# Patient Record
Sex: Male | Born: 1945 | Race: White | Hispanic: No | Marital: Single | Smoking: Former smoker
Health system: Southern US, Community
[De-identification: ages and names within clinical notes are randomized; demographics above are authoritative.]

## PROBLEM LIST (undated history)

## (undated) DIAGNOSIS — I10 Essential (primary) hypertension: Secondary | ICD-10-CM

## (undated) DIAGNOSIS — E119 Type 2 diabetes mellitus without complications: Secondary | ICD-10-CM

---

## 2017-11-02 ENCOUNTER — Emergency Department: Payer: Non-veteran care

## 2017-11-02 ENCOUNTER — Observation Stay
Admission: EM | Admit: 2017-11-02 | Discharge: 2017-11-03 | Discharge status: Against Medical Advice | Payer: Non-veteran care | Attending: Internal Medicine | Admitting: Internal Medicine

## 2017-11-02 DIAGNOSIS — Z87891 Personal history of nicotine dependence: Secondary | ICD-10-CM | POA: Insufficient documentation

## 2017-11-02 DIAGNOSIS — E860 Dehydration: Secondary | ICD-10-CM | POA: Insufficient documentation

## 2017-11-02 DIAGNOSIS — D649 Anemia, unspecified: Secondary | ICD-10-CM | POA: Insufficient documentation

## 2017-11-02 DIAGNOSIS — Z79899 Other long term (current) drug therapy: Secondary | ICD-10-CM | POA: Insufficient documentation

## 2017-11-02 DIAGNOSIS — E871 Hypo-osmolality and hyponatremia: Secondary | ICD-10-CM | POA: Insufficient documentation

## 2017-11-02 DIAGNOSIS — N179 Acute kidney failure, unspecified: Principal | ICD-10-CM | POA: Insufficient documentation

## 2017-11-02 DIAGNOSIS — E876 Hypokalemia: Secondary | ICD-10-CM | POA: Insufficient documentation

## 2017-11-02 DIAGNOSIS — E8809 Other disorders of plasma-protein metabolism, not elsewhere classified: Secondary | ICD-10-CM | POA: Insufficient documentation

## 2017-11-02 DIAGNOSIS — E878 Other disorders of electrolyte and fluid balance, not elsewhere classified: Secondary | ICD-10-CM | POA: Insufficient documentation

## 2017-11-02 DIAGNOSIS — N281 Cyst of kidney, acquired: Secondary | ICD-10-CM | POA: Insufficient documentation

## 2017-11-02 DIAGNOSIS — Z794 Long term (current) use of insulin: Secondary | ICD-10-CM | POA: Insufficient documentation

## 2017-11-02 DIAGNOSIS — E1165 Type 2 diabetes mellitus with hyperglycemia: Secondary | ICD-10-CM | POA: Insufficient documentation

## 2017-11-02 DIAGNOSIS — E778 Other disorders of glycoprotein metabolism: Secondary | ICD-10-CM | POA: Insufficient documentation

## 2017-11-02 DIAGNOSIS — I700 Atherosclerosis of aorta: Secondary | ICD-10-CM | POA: Insufficient documentation

## 2017-11-02 DIAGNOSIS — I959 Hypotension, unspecified: Secondary | ICD-10-CM | POA: Diagnosis present

## 2017-11-02 DIAGNOSIS — I10 Essential (primary) hypertension: Secondary | ICD-10-CM | POA: Insufficient documentation

## 2017-11-02 DIAGNOSIS — R740 Nonspecific elevation of levels of transaminase and lactic acid dehydrogenase [LDH]: Secondary | ICD-10-CM | POA: Insufficient documentation

## 2017-11-02 DIAGNOSIS — I9589 Other hypotension: Secondary | ICD-10-CM

## 2017-11-02 HISTORY — DX: Essential (primary) hypertension: I10

## 2017-11-02 HISTORY — DX: Type 2 diabetes mellitus without complications: E11.9

## 2017-11-02 LAB — URINALYSIS, COMPLETE (UACMP) WITH MICROSCOPIC
BILIRUBIN URINE: NEGATIVE
Bacteria, UA: NONE SEEN
Glucose, UA: >=500 mg/dL — AB
Hgb urine dipstick: NEGATIVE
KETONES UR: NEGATIVE mg/dL
Leukocytes, UA: NEGATIVE
Nitrite: NEGATIVE
Protein, ur: 100 mg/dL — AB
SPECIFIC GRAVITY, URINE: 1.018 (ref 1.005–1.030)
pH: 5 (ref 5.0–8.0)

## 2017-11-02 LAB — CBC WITH DIFFERENTIAL/PLATELET
BASOS ABS: 0 10*3/uL (ref 0–0.1)
BASOS PCT: 0 %
Eosinophils Absolute: 0.4 10*3/uL (ref 0–0.7)
Eosinophils Relative: 4 %
HEMATOCRIT: 33.1 % — AB (ref 40.0–52.0)
HEMOGLOBIN: 12.1 g/dL — AB (ref 13.0–18.0)
LYMPHS PCT: 9 %
Lymphs Abs: 0.8 10*3/uL — ABNORMAL LOW (ref 1.0–3.6)
MCH: 31.1 pg (ref 26.0–34.0)
MCHC: 36.7 g/dL — ABNORMAL HIGH (ref 32.0–36.0)
MCV: 84.8 fL (ref 80.0–100.0)
Monocytes Absolute: 0.7 10*3/uL (ref 0.2–1.0)
Monocytes Relative: 8 %
NEUTROS ABS: 7.5 10*3/uL — AB (ref 1.4–6.5)
NEUTROS PCT: 79 %
Platelets: 241 10*3/uL (ref 150–440)
RBC: 3.9 MIL/uL — AB (ref 4.40–5.90)
RDW: 13.6 % (ref 11.5–14.5)
WBC: 9.5 10*3/uL (ref 3.8–10.6)

## 2017-11-02 LAB — COMPREHENSIVE METABOLIC PANEL
ALBUMIN: 3.3 g/dL — AB (ref 3.5–5.0)
ALK PHOS: 73 U/L (ref 38–126)
ALT: 19 U/L (ref 0–44)
AST: 48 U/L — ABNORMAL HIGH (ref 15–41)
Anion gap: 11 (ref 5–15)
BILIRUBIN TOTAL: 0.9 mg/dL (ref 0.3–1.2)
BUN: 41 mg/dL — AB (ref 8–23)
CO2: 29 mmol/L (ref 22–32)
Calcium: 8.8 mg/dL — ABNORMAL LOW (ref 8.9–10.3)
Chloride: 92 mmol/L — ABNORMAL LOW (ref 98–111)
Creatinine, Ser: 1.95 mg/dL — ABNORMAL HIGH (ref 0.61–1.24)
GFR calc Af Amer: 38 mL/min — ABNORMAL LOW (ref >60)
GFR, EST NON AFRICAN AMERICAN: 33 mL/min — AB (ref >60)
GLUCOSE: 256 mg/dL — AB (ref 70–99)
POTASSIUM: 3.3 mmol/L — AB (ref 3.5–5.1)
Sodium: 132 mmol/L — ABNORMAL LOW (ref 135–145)
TOTAL PROTEIN: 6 g/dL — AB (ref 6.5–8.1)

## 2017-11-02 LAB — TROPONIN I

## 2017-11-02 LAB — GLUCOSE, CAPILLARY: Glucose-Capillary: 260 mg/dL — ABNORMAL HIGH (ref 70–99)

## 2017-11-02 MED ORDER — SODIUM CHLORIDE 0.9 % IV SOLN
Freq: Once | INTRAVENOUS | Status: AC
  Administered 2017-11-02: 20:00:00 via INTRAVENOUS

## 2017-11-02 MED ORDER — SODIUM CHLORIDE 0.9 % IV BOLUS
1000.0000 mL | Freq: Once | INTRAVENOUS | Status: AC
  Administered 2017-11-02: 1000 mL via INTRAVENOUS

## 2017-11-02 NOTE — ED Notes (Signed)
Pt sleeping soundly in darkened exam room; resp even/unlab with no distress noted 

## 2017-11-02 NOTE — ED Notes (Signed)
Per [place], [place] has rejected pt for transfer to [facility].

## 2017-11-02 NOTE — ED Triage Notes (Addendum)
Pt BIB [facility] from home for hypotension. Friend came to visit patient and stated pt did not look well, took pt to a restaurant and there they called EMS. BP initially 52/20, PIV placed by EMS and 800cc NS infused. BP 80/60. Pt endorses progressive dizziness over last few days. Blood glucose 238. Pt endorses constant pain to L neck for 3 weeks as well. Pt denies CP/SOB/N/V/D. Pt takes Lisinopril, states took morning dose and doesn't think took too much.

## 2017-11-02 NOTE — ED Notes (Signed)
Veronese MD to bedside with update 

## 2017-11-02 NOTE — ED Provider Notes (Addendum)
[facility] Emergency Department Provider Note  ____________________________________________  Time seen: Approximately 4:44 PM  I have reviewed the triage vital signs and the nursing notes.   HISTORY  Chief Complaint Hypotension   HPI Angel Ferrell is a 72 y.o. male with a history of diabetes and hypertension who presents to the emergency room for evaluation of hypotension.  Patient reports that over the last 2 to 3 weeks he has been feeling very weak.  Patient reports that 2 weeks ago he had several days of watery diarrhea.  That resolved about 2 weeks ago and since then patient has been feeling very weak and having dizzy spells.  Has had several episodes where he feels like is going to pass out but never fully lost consciousness.  No nausea or vomiting, no fever chills, no chest pain or shortness of breath, no cough, no abdominal pain.  He does not take any blood thinners.  No melena or hematochezia or hematemesis.  No dysuria or hematuria.  Today a friend came to visit him and felt that patient did not look well.  Knowing the patient is a diabetic he drove him to a local restaurant to give him some food but that did not make the patient look any better so he called 911.  When EMS arrived patient's blood pressures systolics in the 50s.  He received IV fluids with improvement of his blood pressure.  Patient takes lisinopril for blood pressure and insulin.  Last took his lisinopril this morning.  Patient reports that he has not had much to eat, he lives alone, all he has had is canned food and water for the last few weeks. No alcohol, drugs.  Patient's only complaint at this time is a sharp pain located in the left side of his neck which has been constant for 3 weeks, not worse with movement, no paresthesias, weakness, or numbness of his extremities. No HA.  PMH DM2 HTN  Allergies Patient has no known allergies.  History reviewed. No pertinent family  history.  Social History Smoking: former Alcohol : former Drugs: no  Review of Systems  Constitutional: Negative for fever. + Generalized weakness and dizziness Eyes: Negative for visual changes. ENT: Negative for sore throat. Neck: + neck pain  Cardiovascular: Negative for chest pain. Respiratory: Negative for shortness of breath. Gastrointestinal: Negative for abdominal pain, vomiting. + diarrhea. Genitourinary: Negative for dysuria. Musculoskeletal: Negative for back pain. Skin: Negative for rash. Neurological: Negative for headaches, weakness or numbness. Psych: No SI or HI  ____________________________________________   PHYSICAL EXAM:  VITAL SIGNS: ED Triage Vitals  Enc Vitals Group     BP 11/02/17 1636 (!) 78/46     Pulse Rate 11/02/17 1636 65     Resp 11/02/17 1636 19     Temp --      Temp src --      SpO2 11/02/17 1636 94 %     Weight 11/02/17 1640 170 lb (77.1 kg)     Height 11/02/17 1640 6' (1.829 m)     Head Circumference --      Peak Flow --      Pain Score 11/02/17 1637 8     Pain Loc --      Pain Edu? --      Excl. in GC? --     Constitutional: Alert and oriented, disheveled.  HEENT:      Head: Normocephalic and atraumatic.         Eyes: Conjunctivae are  normal. Sclera is non-icteric.       Mouth/Throat: Mucous membranes are dry.  Oropharynx is clear      Neck: Supple with no signs of meningismus.  No bruits, full painless range of motion, no masses palpable, pain is not reproducible with movement or palpation of the neck Cardiovascular: Regular rate and rhythm. No murmurs, gallops, or rubs. 2+ symmetrical distal pulses are present in all extremities. No JVD. Respiratory: Normal respiratory effort. Lungs are clear to auscultation bilaterally. No wheezes, crackles, or rhonchi.  Gastrointestinal: Soft, non tender, and non distended with positive bowel sounds. No rebound or guarding. Musculoskeletal: Nontender with normal range of motion in all  extremities. No edema, cyanosis, or erythema of extremities. Neurologic: Normal speech and language. Face is symmetric. Moving all extremities. No gross focal neurologic deficits are appreciated. Skin: Skin is warm, dry and intact. No rash noted. Psychiatric: Mood and affect are normal. Speech and behavior are normal.  ____________________________________________   LABS (all labs ordered are listed, but only abnormal results are displayed)  Labs Reviewed  GLUCOSE, CAPILLARY - Abnormal; Notable for the following components:      Result Value   Glucose-Capillary 260 (*)    All other components within normal limits  CBC WITH DIFFERENTIAL/PLATELET - Abnormal; Notable for the following components:   RBC 3.90 (*)    Hemoglobin 12.1 (*)    HCT 33.1 (*)    MCHC 36.7 (*)    Neutro Abs 7.5 (*)    Lymphs Abs 0.8 (*)    All other components within normal limits  COMPREHENSIVE METABOLIC PANEL - Abnormal; Notable for the following components:   Sodium 132 (*)    Potassium 3.3 (*)    Chloride 92 (*)    Glucose, Bld 256 (*)    BUN 41 (*)    Creatinine, Ser 1.95 (*)    Calcium 8.8 (*)    Total Protein 6.0 (*)    Albumin 3.3 (*)    AST 48 (*)    GFR calc non Af Amer 33 (*)    GFR calc Af Amer 38 (*)    All other components within normal limits  URINALYSIS, COMPLETE (UACMP) WITH MICROSCOPIC - Abnormal; Notable for the following components:   Color, Urine AMBER (*)    APPearance CLOUDY (*)    Glucose, UA >=500 (*)    Protein, ur 100 (*)    All other components within normal limits  TROPONIN I   ____________________________________________  EKG  ED ECG REPORT I, Nita Sickle, the attending physician, personally viewed and interpreted this ECG.  Normal sinus rhythm, rate of 65, normal PR and QRS intervals, prolonged QTC, normal axis, no ST elevations or depressions.  No prior for comparison. ____________________________________________  RADIOLOGY  I have personally reviewed  the images performed during this visit and I agree with the Radiologist's read.   Interpretation by Radiologist:  Dg Chest 2 View  Result Date: 11/02/2017 CLINICAL DATA:  Hypotension. EXAM: CHEST - 2 VIEW COMPARISON:  None. FINDINGS: Normal cardiac and mediastinal contours. Aortic atherosclerosis. No large area pulmonary consolidation. No pleural effusion or pneumothorax. Thoracic spine degenerative changes. IMPRESSION: No acute cardiopulmonary process. Electronically Signed   By: Annia Belt M.D.   On: 11/02/2017 18:02      ____________________________________________   PROCEDURES  Procedure(s) performed: None Procedures Critical Care performed:  None ____________________________________________   INITIAL IMPRESSION / ASSESSMENT AND PLAN / ED COURSE   72 y.o. male with a history of diabetes and hypertension  who presents to the emergency room for evaluation of hypotension.  Patient has had symptoms of lightheadedness and generalized weakness for 2 weeks since having had some diarrhea at home.  The diarrhea has resolved 2 weeks ago.  At this time patient is disheveled, hypotensive,with good mental status and no distress. patient is neurologically intact, looks very dry on exam, blood pressure 78/46 which is identical on both upper extremities. Patient has very strong peripheral pulses palpable on exam x 4 therefore BP was taken manually and agrees with electronic BP cuff read. No palpable pulsatile mass on exam, no tenderness of the abdomen on palpation. EKG with no evidence of ischemia. No tachycardia, no fever. Ddx severe dehydration, anemia. Will give IVF, labs pending.    6:44 PM on 11/02/2017 -----------------------------------------  Labs show acute kidney injury with creatinine of 1.95.  No signs of anemia.  UA negative for UTI.  Chest x-ray with no infiltrate.  Patient's blood pressure has improved after IV fluids.  Will admit for IV hydration.   Clinical Course as of Nov 02 2204  Mon Nov 02, 2017  2206 [facility] and recommended [facility] patient here for hydration, will discuss with Hospitalist    [CV]    Clinical Course User Index [CV] Don Perking [place], MD   _________________________   As part of my medical decision making, I reviewed the following data within the electronic MEDICAL RECORD NUMBER Nursing notes reviewed and incorporated, Labs reviewed , EKG interpreted , Radiograph reviewed , Discussed with admitting physician , Notes from prior ED visits and [place] Controlled Substance Database    Pertinent labs & imaging results that were available during my care of the patient were reviewed by me and considered in my medical decision making (see chart for details).    ____________________________________________   FINAL CLINICAL IMPRESSION(S) / ED DIAGNOSES  Final diagnoses:  AKI (acute kidney injury) (HCC)  Hypotension, unspecified hypotension type      NEW MEDICATIONS STARTED DURING THIS VISIT:  ED Discharge Orders    None       Note:  This document was prepared using Dragon voice recognition software and may include unintentional dictation errors.    Don Perking, [place], MD 11/02/17 1845    Nita Sickle, MD 11/02/17 2206    Nita Sickle, MD 11/02/17 [phone]

## 2017-11-02 NOTE — ED Notes (Signed)
Angel Ferrell [phone] Pts niece would like updates if pt going to [facility]/staying here

## 2017-11-03 ENCOUNTER — Observation Stay
Admit: 2017-11-03 | Discharge: 2017-11-03 | Disposition: A | Discharge status: Home / Self Care | Payer: Non-veteran care | Attending: Internal Medicine | Admitting: Internal Medicine

## 2017-11-03 ENCOUNTER — Encounter: Payer: Self-pay | Admitting: Internal Medicine

## 2017-11-03 ENCOUNTER — Inpatient Hospital Stay: Payer: Non-veteran care

## 2017-11-03 DIAGNOSIS — I959 Hypotension, unspecified: Secondary | ICD-10-CM | POA: Diagnosis present

## 2017-11-03 DIAGNOSIS — I9589 Other hypotension: Secondary | ICD-10-CM

## 2017-11-03 LAB — MAGNESIUM: Magnesium: 1.7 mg/dL (ref 1.7–2.4)

## 2017-11-03 LAB — NA AND K (SODIUM & POTASSIUM), RAND UR
POTASSIUM UR: 24 mmol/L
Sodium, Ur: 12 mmol/L

## 2017-11-03 LAB — PHOSPHORUS: PHOSPHORUS: 1.8 mg/dL — AB (ref 2.5–4.6)

## 2017-11-03 LAB — ECHOCARDIOGRAM COMPLETE
Height: 72 in
Weight: 2720 oz

## 2017-11-03 LAB — PROTEIN, URINE, RANDOM: Total Protein, Urine: 68 mg/dL

## 2017-11-03 LAB — GLUCOSE, CAPILLARY
GLUCOSE-CAPILLARY: 351 mg/dL — AB (ref 70–99)
GLUCOSE-CAPILLARY: 453 mg/dL — AB (ref 70–99)
Glucose-Capillary: 336 mg/dL — ABNORMAL HIGH (ref 70–99)

## 2017-11-03 LAB — TROPONIN I: Troponin I: <0.03 ng/mL (ref <0.03)

## 2017-11-03 LAB — PREALBUMIN: PREALBUMIN: 12.2 mg/dL — AB (ref 18–38)

## 2017-11-03 LAB — CREATININE, URINE, RANDOM: CREATININE, URINE: 287 mg/dL

## 2017-11-03 MED ORDER — ACETAMINOPHEN 325 MG PO TABS: 650.0000 mg | ORAL_TABLET | Freq: Four times a day (QID) | ORAL | Status: DC | PRN

## 2017-11-03 MED ORDER — INSULIN ASPART 100 UNIT/ML ~~LOC~~ SOLN
24.0000 [IU] | Freq: Once | SUBCUTANEOUS | Status: AC
  Administered 2017-11-03: 24 [IU] via SUBCUTANEOUS
  Filled 2017-11-03: qty 1

## 2017-11-03 MED ORDER — INSULIN ASPART 100 UNIT/ML ~~LOC~~ SOLN
0.0000 [IU] | Freq: Three times a day (TID) | SUBCUTANEOUS | Status: DC
  Administered 2017-11-03: 15 [IU] via SUBCUTANEOUS
  Filled 2017-11-03: qty 1

## 2017-11-03 MED ORDER — INSULIN GLARGINE 100 UNIT/ML ~~LOC~~ SOLN
8.0000 [IU] | Freq: Every day | SUBCUTANEOUS | Status: DC
  Administered 2017-11-03: 8 [IU] via SUBCUTANEOUS
  Filled 2017-11-03: qty 0.08

## 2017-11-03 MED ORDER — POTASSIUM CHLORIDE CRYS ER 20 MEQ PO TBCR
40.0000 meq | EXTENDED_RELEASE_TABLET | Freq: Once | ORAL | Status: AC
  Administered 2017-11-03: 40 meq via ORAL
  Filled 2017-11-03: qty 2

## 2017-11-03 MED ORDER — ONDANSETRON HCL 4 MG PO TABS: 4.0000 mg | ORAL_TABLET | Freq: Four times a day (QID) | ORAL | Status: DC | PRN

## 2017-11-03 MED ORDER — SENNOSIDES-DOCUSATE SODIUM 8.6-50 MG PO TABS: 1.0000 | ORAL_TABLET | Freq: Every evening | ORAL | Status: DC | PRN

## 2017-11-03 MED ORDER — GABAPENTIN 300 MG PO CAPS
300.0000 mg | ORAL_CAPSULE | Freq: Three times a day (TID) | ORAL | Status: DC
  Administered 2017-11-03: 300 mg via ORAL
  Filled 2017-11-03 (×3): qty 1

## 2017-11-03 MED ORDER — SODIUM CHLORIDE 0.9% FLUSH
3.0000 mL | Freq: Two times a day (BID) | INTRAVENOUS | Status: DC
  Administered 2017-11-03: 3 mL via INTRAVENOUS

## 2017-11-03 MED ORDER — ONDANSETRON HCL 4 MG/2ML IJ SOLN: 4.0000 mg | Freq: Four times a day (QID) | INTRAMUSCULAR | Status: DC | PRN

## 2017-11-03 MED ORDER — ALUM & MAG HYDROXIDE-SIMETH 200-200-20 MG/5ML PO SUSP: 30.0000 mL | Freq: Four times a day (QID) | ORAL | Status: DC | PRN

## 2017-11-03 MED ORDER — ASPIRIN 81 MG PO CHEW
81.0000 mg | CHEWABLE_TABLET | Freq: Every day | ORAL | Status: DC
  Filled 2017-11-03: qty 1

## 2017-11-03 MED ORDER — SODIUM CHLORIDE 0.9 % IV SOLN
INTRAVENOUS | Status: AC
  Administered 2017-11-03: 05:00:00 via INTRAVENOUS

## 2017-11-03 MED ORDER — MAGNESIUM SULFATE IN D5W 1-5 GM/100ML-% IV SOLN
1.0000 g | Freq: Once | INTRAVENOUS | Status: AC
  Administered 2017-11-03: 1 g via INTRAVENOUS
  Filled 2017-11-03: qty 100

## 2017-11-03 MED ORDER — HEPARIN SODIUM (PORCINE) 5000 UNIT/ML IJ SOLN
5000.0000 [IU] | Freq: Three times a day (TID) | INTRAMUSCULAR | Status: DC
  Administered 2017-11-03: 5000 [IU] via SUBCUTANEOUS
  Filled 2017-11-03: qty 1

## 2017-11-03 MED ORDER — INSULIN ASPART 100 UNIT/ML ~~LOC~~ SOLN: 0.0000 [IU] | Freq: Every day | SUBCUTANEOUS | Status: DC

## 2017-11-03 MED ORDER — ENSURE ENLIVE PO LIQD
237.0000 mL | Freq: Two times a day (BID) | ORAL | Status: DC
  Administered 2017-11-03 (×2): 237 mL via ORAL

## 2017-11-03 MED ORDER — ACETAMINOPHEN 650 MG RE SUPP: 650.0000 mg | Freq: Four times a day (QID) | RECTAL | Status: DC | PRN

## 2017-11-03 MED ORDER — K PHOS MONO-SOD PHOS DI & MONO 155-852-130 MG PO TABS
250.0000 mg | ORAL_TABLET | Freq: Two times a day (BID) | ORAL | Status: DC
  Administered 2017-11-03: 250 mg via ORAL
  Filled 2017-11-03 (×2): qty 1

## 2017-11-03 MED ORDER — INSULIN ASPART 100 UNIT/ML ~~LOC~~ SOLN
5.0000 [IU] | Freq: Three times a day (TID) | SUBCUTANEOUS | Status: DC
  Administered 2017-11-03 (×2): 5 [IU] via SUBCUTANEOUS
  Filled 2017-11-03 (×2): qty 1

## 2017-11-03 MED ORDER — BISACODYL 5 MG PO TBEC
5.0000 mg | DELAYED_RELEASE_TABLET | Freq: Every day | ORAL | Status: DC | PRN
  Filled 2017-11-03: qty 1

## 2017-11-03 NOTE — ED Notes (Signed)
Pt awakens easily with no c/o voiced; pt informed that he will be remaining in the ED until an admission room is available in the hospital; pt voices good understanding

## 2017-11-03 NOTE — Progress Notes (Signed)
*  PRELIMINARY RESULTS* Echocardiogram 2D Echocardiogram has been performed.  Angel Ferrell Angel Ferrell 11/03/2017, 12:47 PM

## 2017-11-03 NOTE — ED Notes (Signed)
Verbal order from Dr. Nemiah Commander for 24 units of novolog for CBG of 453.

## 2017-11-03 NOTE — ED Notes (Signed)
Pt refusing to go upstairs. States he is going to put on his clothes and leave. Pt stating that staff is lying to him. Pt informed that this RN has not lied to him, has been straight forward with pt. Pt asked if he would stay and wait for results of tests, he refuses. States he has to go pay his bills. During pt yelling at this Eula Listen RN brings in pt Ensure. Pt remains refusing to stay. Pt sitting on edge of bed, niece at bedside, states pt is impatient. Paging Dr. Nemiah Commander at this time to update her.

## 2017-11-03 NOTE — ED Notes (Signed)
ECHO being performed at this time ?

## 2017-11-03 NOTE — H&P (Signed)
[facility] [place] [facility]   PATIENT NAME: Angel Ferrell    MR#:  [id number]  DATE OF BIRTH:  01/16/1946  DATE OF ADMISSION:  11/02/2017  PRIMARY CARE PHYSICIAN: [facility]   REQUESTING/REFERRING PHYSICIAN: Nita Sickle, MD  CHIEF COMPLAINT:   Chief Complaint  Patient presents with  . Hypotension    HISTORY OF PRESENT ILLNESS:  Angel Ferrell  is a 72 y.o. caucasian male with a known history of T2IDDM, HTN p/w volume-responsive hypotension. He endorses a  2-3wk Hx of fatigue/malaise/generalized weakness and intermittent lightheadedness/near-syncope. He states he has been "shaky"/tremulous and unsteady on his feet as well. He states the symptoms have been getting progressively worse. He states he is feeling much improved since coming to the ED. He endorses poor PO intake, because he doesn't have enough money, but also because he has poor appetite at times. He states he is taking his home medications, "most of the time", but "forget(s) about twice a week, maybe." He endorses difficulty w/ memory on & off x67mo, but is AAOx3 and seems to be of sound mind as of my assessment. During the day (Monday 11/02/2017), his friend thought he appeared unwell; his friend had suspected the pt was hypoglycemic, however there was no improvement in symptoms after PO intake; EMS was called, and pt was found to be hypotensive (SBP 50s, reportedly). He clinically improved w/ administrated of IVF. Initial recorded BP in [facility] ED 78/46. Has cont'd to receive IVF. BP 110s/70s at the time of my assessment. He is a [place] pt, but apparently the [place] was contacted and refused transfer. As I am not privy to the details of the conversation between the ED provider and the [place], I do not know the reason for refusal; I can only surmise that the issue pertains to census/bed board. Pt presently appears well. He is comfortable, pain-free and not in acute distress. He states he is going to  [place] on Friday, and hopes to be discharged home soon. He has agreed to stay for now, but states he will likely leave the hospital by Wednesday (11/04/2017) at the latest, as he has, "things to do," before he leaves on his trip.  PAST MEDICAL HISTORY:   Past Medical History:  Diagnosis Date  . Diabetes mellitus without complication (HCC)   . Hypertension     PAST SURGICAL HISTORY:  History reviewed. No pertinent surgical history.  SOCIAL HISTORY:   Social History   Tobacco Use  . Smoking status: Former Games developer  . Smokeless tobacco: Former Neurosurgeon  . Tobacco comment: 50 years ago  Substance Use Topics  . Alcohol use: Not Currently    FAMILY HISTORY:  History reviewed. No pertinent family history.  DRUG ALLERGIES:  No Known Allergies  REVIEW OF SYSTEMS:   Review of Systems  Constitutional: Positive for malaise/fatigue. Negative for chills, diaphoresis, fever and weight loss.  HENT: Negative for congestion, ear pain, hearing loss, nosebleeds, sinus pain, sore throat and tinnitus.   Eyes: Negative for blurred vision, double vision and photophobia.  Respiratory: Negative for cough, hemoptysis, sputum production, shortness of breath and wheezing.   Cardiovascular: Negative for chest pain, palpitations, orthopnea, claudication, leg swelling and PND.  Gastrointestinal: Negative for abdominal pain, blood in stool, constipation, diarrhea, heartburn, melena, nausea and vomiting.  Genitourinary: Negative for dysuria, frequency, hematuria and urgency.  Musculoskeletal: Negative for back pain, joint pain, myalgias and neck pain.  Skin: Negative for itching and rash.  Neurological: Positive for  dizziness, tremors and weakness. Negative for tingling, sensory change, speech change, focal weakness, seizures, loss of consciousness and headaches.  Psychiatric/Behavioral: Negative for memory loss. The patient is not nervous/anxious and does not have insomnia.    MEDICATIONS AT HOME:   Prior  to Admission medications   Medication Sig Start Date End Date Taking? Authorizing Provider  gabapentin (NEURONTIN) 300 MG capsule Take 300 mg by mouth 3 (three) times daily.   Yes [provider]  insulin aspart (NOVOLOG) 100 UNIT/ML injection Inject 8 Units into the skin 3 (three) times daily before meals.   Yes [provider]  lisinopril (PRINIVIL,ZESTRIL) 2.5 MG tablet Take 2.5 mg by mouth daily.   Yes [provider]  Melatonin 5 MG TABS Take 5 mg by mouth at bedtime.   Yes [provider]  metFORMIN (GLUCOPHAGE) 1000 MG tablet Take 1,000 mg by mouth 2 (two) times daily with a meal.   Yes [provider]  PARoxetine (PAXIL) 40 MG tablet Take 40 mg by mouth at bedtime.   Yes [provider]      VITAL SIGNS:  Blood pressure 117/77, pulse 88, resp. rate 16, height 6' (1.829 m), weight 77.1 kg, SpO2 95 %.  PHYSICAL EXAMINATION:  Physical Exam  Constitutional: He is oriented to person, place, and time. He appears well-developed and well-nourished. He is active and cooperative.  Non-toxic appearance. He does not have a sickly appearance. He does not appear ill. No distress. He is not intubated.  HENT:  Head: Normocephalic and atraumatic.  Mouth/Throat: Oropharynx is clear and moist. Mucous membranes are dry. No oropharyngeal exudate.  Eyes: Conjunctivae, EOM and lids are normal. No scleral icterus.  Neck: Neck supple. No JVD present. No thyromegaly present.  Cardiovascular: Normal rate, regular rhythm, S1 normal, S2 normal and normal heart sounds.  No extrasystoles are present. Exam reveals no gallop, no S3, no S4, no distant heart sounds and no friction rub.  No murmur heard. Pulmonary/Chest: Effort normal and breath sounds normal. No accessory muscle usage or stridor. No apnea, no tachypnea and no bradypnea. He is not intubated. No respiratory distress. He has no decreased breath sounds. He has no wheezes. He has no rhonchi. He has no  rales.  Abdominal: Soft. He exhibits no distension. Bowel sounds are decreased. There is no tenderness. There is no rigidity, no rebound and no guarding.  Musculoskeletal: Normal range of motion. He exhibits no edema or tenderness.  Lymphadenopathy:    He has no cervical adenopathy.  Neurological: He is alert and oriented to person, place, and time. He is not disoriented.  Skin: Skin is warm, dry and intact. No rash noted. He is not diaphoretic. No erythema. There is pallor.  Psychiatric: He has a normal mood and affect. His speech is normal and behavior is normal. Judgment and thought content normal. Cognition and memory are normal.   LABORATORY PANEL:   CBC Recent Labs  Lab 11/02/17 1647  WBC 9.5  HGB 12.1*  HCT 33.1*  PLT 241   ------------------------------------------------------------------------------------------------------------------  Chemistries  Recent Labs  Lab 11/02/17 1647  NA 132*  K 3.3*  CL 92*  CO2 29  GLUCOSE 256*  BUN 41*  CREATININE 1.95*  CALCIUM 8.8*  AST 48*  ALT 19  ALKPHOS 73  BILITOT 0.9   ------------------------------------------------------------------------------------------------------------------  Cardiac Enzymes Recent Labs  Lab 11/02/17 1647  TROPONINI <0.03   ------------------------------------------------------------------------------------------------------------------  RADIOLOGY:  Dg Chest 2 View  Result Date: 11/02/2017 CLINICAL DATA:  Hypotension. EXAM:  CHEST - 2 VIEW COMPARISON:  None. FINDINGS: Normal cardiac and mediastinal contours. Aortic atherosclerosis. No large area pulmonary consolidation. No pleural effusion or pneumothorax. Thoracic spine degenerative changes. IMPRESSION: No acute cardiopulmonary process. Electronically Signed   By: Annia Belt M.D.   On: 11/02/2017 18:02   IMPRESSION AND PLAN:   A/P: 88M p/w weakness, volume-responsive hypotension, transient and improved w/ IVF.  Lightheadedness/near-syncope. Hypovolemic hypochloremic hyponatremia, dehydration, hypokalemia, hyperglycemia/glycosuria (w/ T2IDDM), prerenal azotemia, suspected AKI superimposed on CKD III, hypocalcemia, hypoalbuminemia/hypoproteinemia, transaminasemia, normocytic anemia. -Generalized weakness, lightheadedness/near-syncope, volume-responsive (transient) hypotension, hypovolemic hypochloremic hyponatremia, dehydration, prerenal azotemia, suspected AKI superimposed on CKD III: Endorses dehydration, lightheadedness/near-syncope. Dry MM. Labwork suggests intravascular volume depletion. Na+ 132, Cl- 92. BUN 41, Cr 1.95, baseline Cr unknown. 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 Renal U/S, urine studies (electrolytes, creatinine, urea nitrogen, protein) pending. IVF, monitor BMP, avoid nephrotoxins. Hold Lisinopril, Metformin. Tele, cardiac monitoring. Orthostatic VS, FSG qHS/AC, neuro check q4h x24hrs, fall precautions. Echo pending. Trop-I (-), rpt pending. ZOX09. Afebrile, (-) leukocytosis, SIRS (-), no infectious symptoms, exam largely unimpressive. -Hypokalemia: Likely 2/2 poor intake. Replete K+. Mag level pending. -Hyperglycemia/glycosuria, T2IDDM: SSI, c/w insulin. Hold PO antihyperglycemics. -Hypocalcemia: Ionized calcium. -Hypoalbuminemia/hypoproteinemia: Suspect malnourishment. Prealbumin. Nutrition consult. Ensure TID. -Transaminasemia: Mild elevation, AST 48 (< 2x ULN), ALT WNL. Likely 2/2 dehydration/volume depletion. -Normocytic anemia: Hgb 12.1. MCV WNL. Likely anemia of chronic disease. No evidence of acute blood loss at present. -c/w other home meds. -FEN/GI: Cardiac diabetic diet as tolerated. -DVT PPx: Heparin. -Code status: Full code. -Disposition: Pt meets Utilization Review criteria for inpatient hospitalization based on his labwork and dataset. However, the pt himself intends to leave the hospital on Wednesday 11/04/2017,  in order to get ready for a trip to [place] at the end of the week. As such, I have designated pt as Observation status, given that he is expected to stay < 2 midnights.   All the records are reviewed and case discussed with ED provider. Management plans discussed with the patient, family and they are in agreement.  CODE STATUS: Full code.  TOTAL TIME TAKING CARE OF THIS PATIENT: 75 minutes.    Barbaraann Rondo M.D on 11/03/2017 at 3:45 AM  Between 7am to 6pm - Pager - [phone]  After 6pm go to [email or web address] - Social research officer, government  [facility]  Office  [phone]  CC: Primary care physician; [facility]   Note: This dictation was prepared with Dragon dictation along with smaller phrase technology. Any transcriptional errors that result from this process are unintentional.

## 2017-11-03 NOTE — ED Notes (Signed)
Pt's niece in room at this time, informed her that I notified the doctor that the patient is wanting to leave and has concerns about personal things he needs to do at home. I also informed her that he is unhappy with his diet and that I have explained that to the doctor already as well.  Pt again stated that he is ready to put on his clothes and leave.  I told pt and family that the doctor would be down soon.  Niece verbalized understanding of this.

## 2017-11-03 NOTE — ED Notes (Signed)
Pt A&Ox3, PERRL, MAEW; grips = & strong, speech clear; steady gait noted, no c/o with performance of orthostatic vs

## 2017-11-03 NOTE — ED Notes (Signed)
Pt awake, alert with no distress; pt assisted into hosp gown; clothing placed in pt belonging bag; pt holding own cell phone; attends on D&I; pt voiding qs in urinal

## 2017-11-03 NOTE — ED Notes (Signed)
Pt eating lunch at this time

## 2017-11-03 NOTE — ED Notes (Signed)
US tech at bedside

## 2017-11-03 NOTE — ED Notes (Signed)
Attempted to call report again.

## 2017-11-03 NOTE — ED Notes (Addendum)
Respiratory notified to do ECHO will come to ED to complete.

## 2017-11-03 NOTE — ED Notes (Signed)
[employer] notified to send Ensure

## 2017-11-03 NOTE — ED Notes (Signed)
Attempted to call report, nurse was in another room. Left number, awaiting to hear back from receiving RN.

## 2017-11-03 NOTE — ED Notes (Signed)
Dr. Nemiah Commander ok to let pt sign out AMA at this time.

## 2017-11-03 NOTE — ED Notes (Signed)
Pt upset about his breakfast, states he has been asking for Ensure for 5 hours and is not happy that his breakfast tray doesn't have any meat on it.  Pt states he is supposed to be out of here at 0830 to pay bills and he is supposed to be on a flight on Friday.  Pt requesting coffee as well. Informed him that he is on a heart healthy diet and he would need to discuss a diet change with the doctor. Also informed him that Ensure has been ordered and that the cafeteria delivers that when they are open in between meals.  Also informed him that in the Emergency Department we do not keep Ensure stocked in our refrigerator.  Pt continues to complain about the dry biscuit and how he has no drink. Informed him that his tray came with apple juice.

## 2017-11-03 NOTE — ED Notes (Addendum)
Per Dr. Nemiah Commander ok to give insulin a total of 20 units + lantus since patient ate before CBG checked.

## 2017-11-04 LAB — UREA NITROGEN, URINE: Urea Nitrogen, Ur: 271 mg/dL

## 2017-11-04 LAB — HEMOGLOBIN A1C
Hgb A1c MFr Bld: 12.3 % — ABNORMAL HIGH (ref 4.8–5.6)
Mean Plasma Glucose: 306 mg/dL

## 2017-11-04 LAB — CALCIUM, IONIZED: Calcium, Ionized, Serum: 4.8 mg/dL (ref 4.5–5.6)

## 2017-11-04 LAB — HIV ANTIBODY (ROUTINE TESTING W REFLEX): HIV Screen 4th Generation wRfx: NONREACTIVE

## 2019-07-25 IMAGING — CR DG CHEST 2V
2 series · 3 of 3 positions shown · non-contrast
Comparison: None.

CLINICAL DATA: Hypotension.

EXAM:
CHEST - 2 VIEW

[Series 2: chest lat · 0.14mm/px · 2 of 2 slices shown]
[im 1/2]
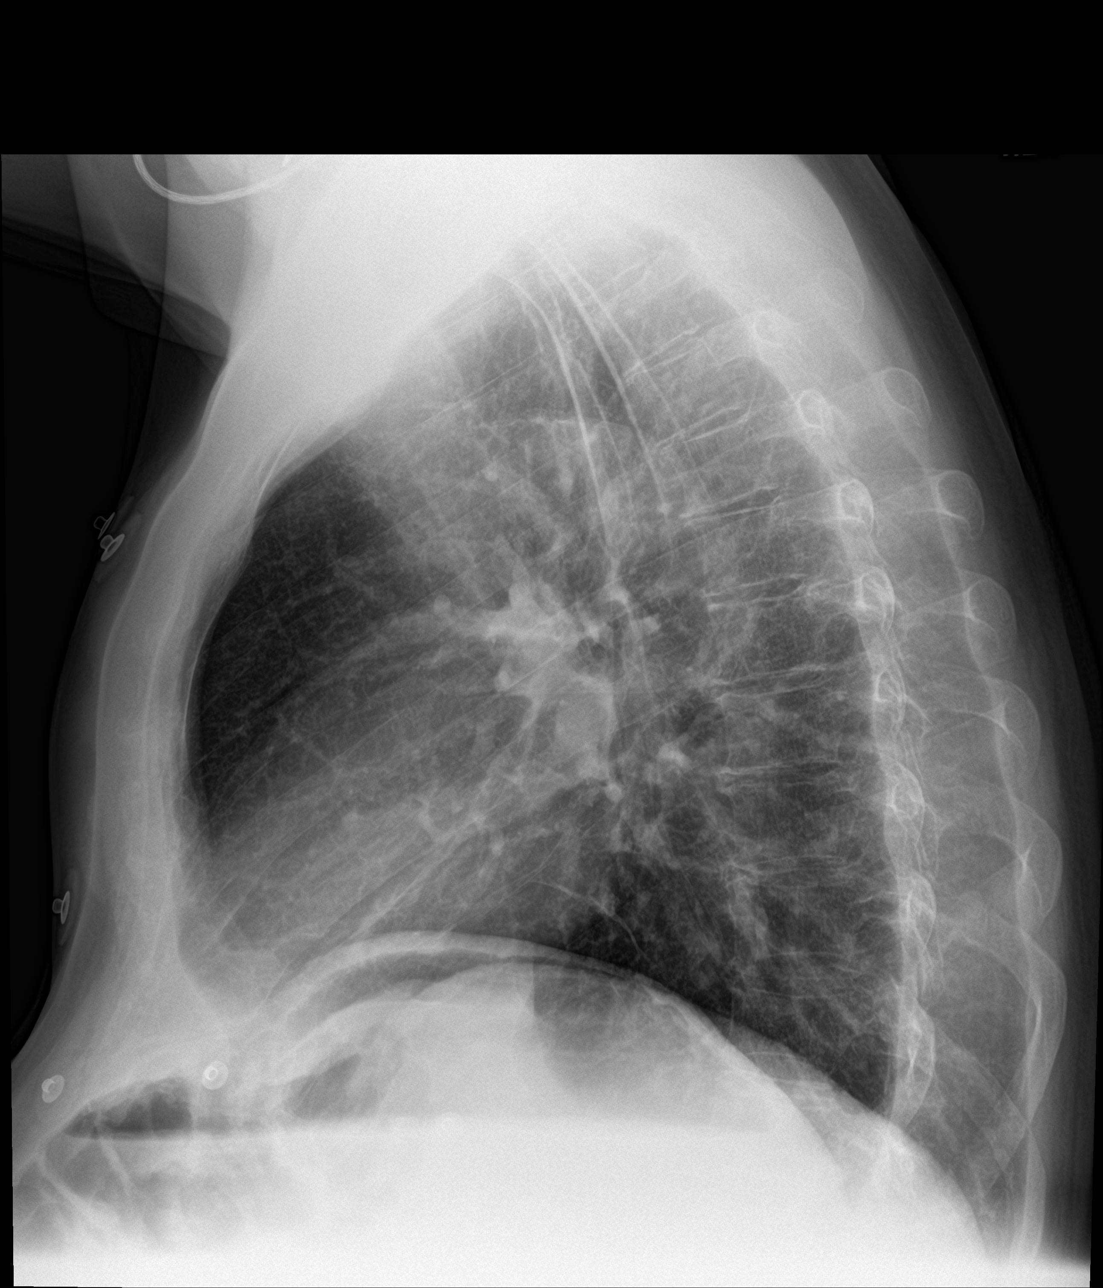
[im 2/2]
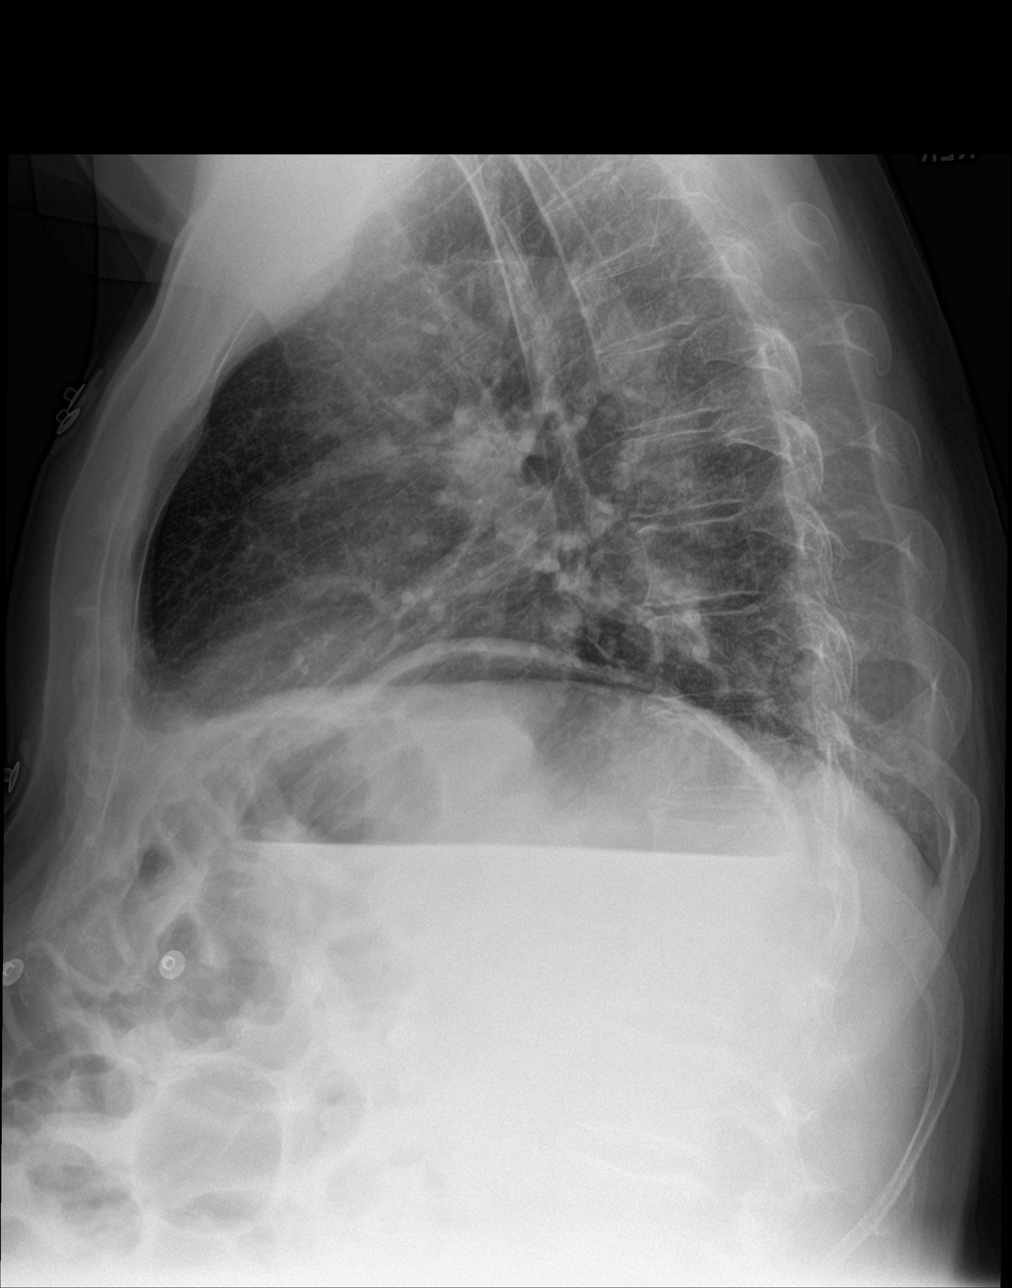

[chest ap]
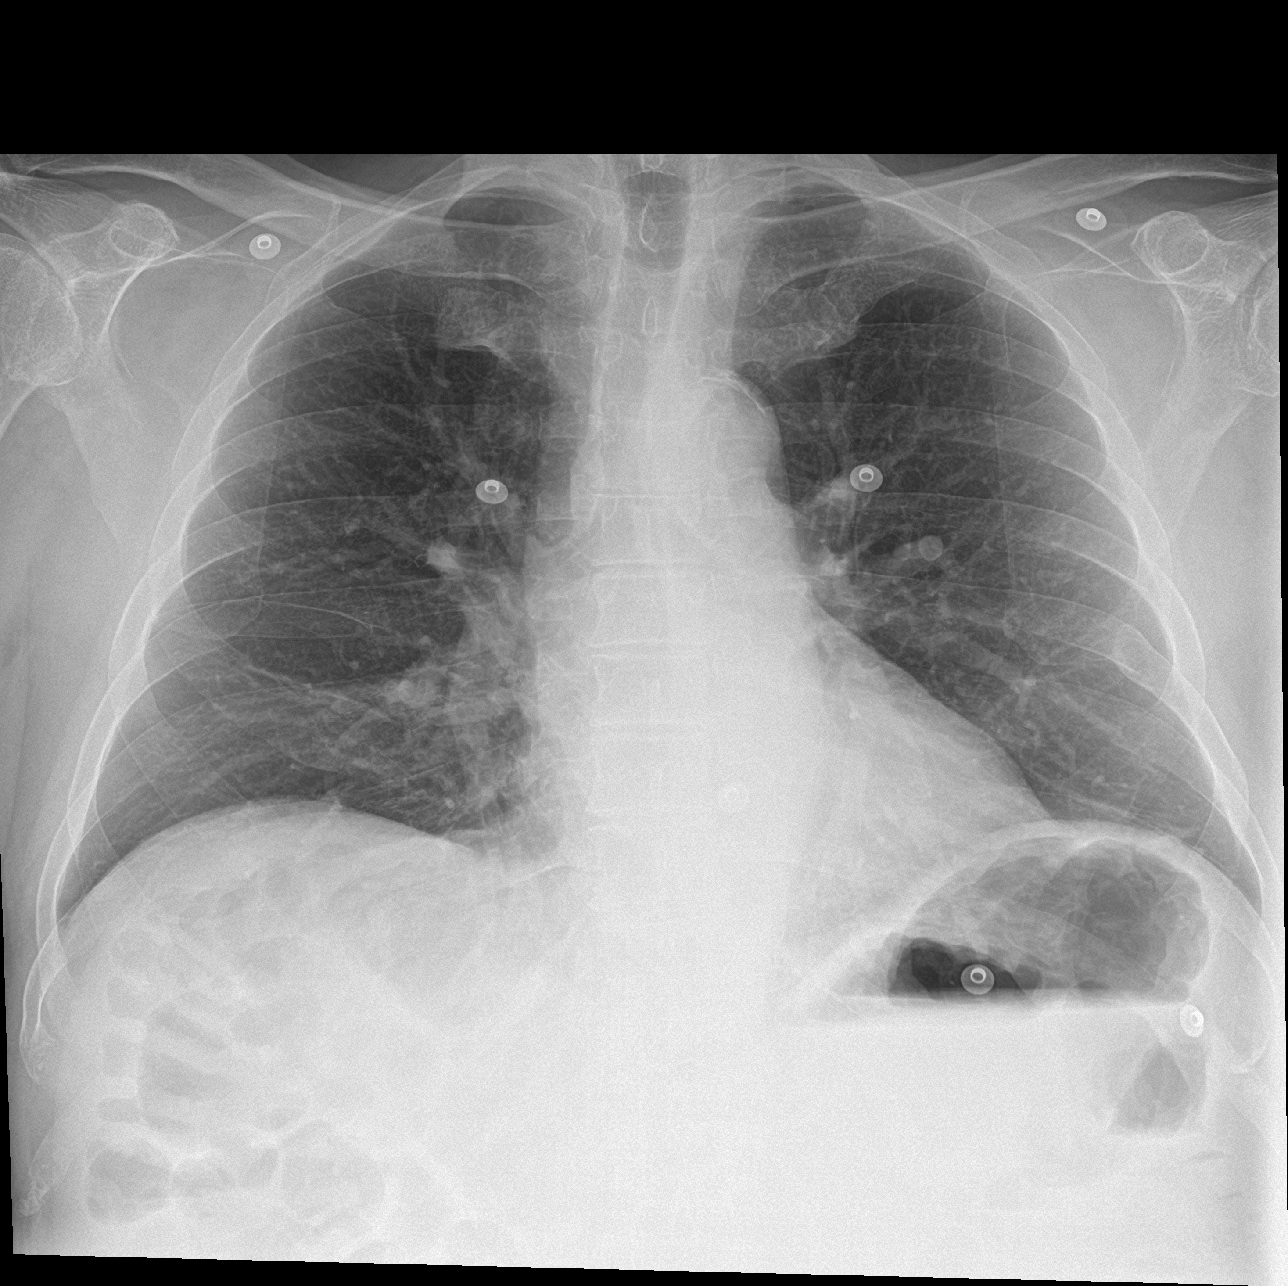

[3 of 3 positions shown; findings below may reference images not displayed]

FINDINGS: Normal cardiac and mediastinal contours. Aortic atherosclerosis. No
large area pulmonary consolidation. No pleural effusion or
pneumothorax. Thoracic spine degenerative changes.
IMPRESSION: No acute cardiopulmonary process.
